# Patient Record
Sex: Male | Born: 1965
Health system: Southern US, Community
[De-identification: ages and names within clinical notes are randomized; demographics above are authoritative.]

## PROBLEM LIST (undated history)

## (undated) HISTORY — PX: HAND SURGERY: SHX662

## (undated) HISTORY — PX: ROTATOR CUFF REPAIR: SHX139

---

## 1997-10-28 ENCOUNTER — Emergency Department (HOSPITAL_COMMUNITY): Admission: EM | Admit: 1997-10-28 | Discharge: 1997-10-28 | Payer: Self-pay | Admitting: Emergency Medicine

## 2002-03-23 ENCOUNTER — Emergency Department (HOSPITAL_COMMUNITY): Admission: EM | Admit: 2002-03-23 | Discharge: 2002-03-23 | Payer: Self-pay | Admitting: Emergency Medicine

## 2004-03-01 ENCOUNTER — Emergency Department (HOSPITAL_COMMUNITY): Admission: EM | Admit: 2004-03-01 | Discharge: 2004-03-02 | Payer: Self-pay | Admitting: Emergency Medicine

## 2004-03-04 ENCOUNTER — Ambulatory Visit (HOSPITAL_BASED_OUTPATIENT_CLINIC_OR_DEPARTMENT_OTHER): Admission: RE | Admit: 2004-03-04 | Discharge: 2004-03-04 | Payer: Self-pay | Admitting: Orthopedic Surgery

## 2006-07-19 ENCOUNTER — Ambulatory Visit (HOSPITAL_BASED_OUTPATIENT_CLINIC_OR_DEPARTMENT_OTHER): Admission: RE | Admit: 2006-07-19 | Discharge: 2006-07-19 | Payer: Self-pay | Admitting: Orthopedic Surgery

## 2007-11-27 IMAGING — CR DG FINGERS 2V UNILAT - NO REPORT
1 series · 3 of 3 positions shown · non-contrast
Comparison: NONE

CLINICAL DATA: Work injury. Pain and swelling of the first MCP 
joint.  

LEFT THUMB FILMS

[Series 1: view not recorded · 0.17mm/px · 3 of 3 slices shown]
[im 1/3]
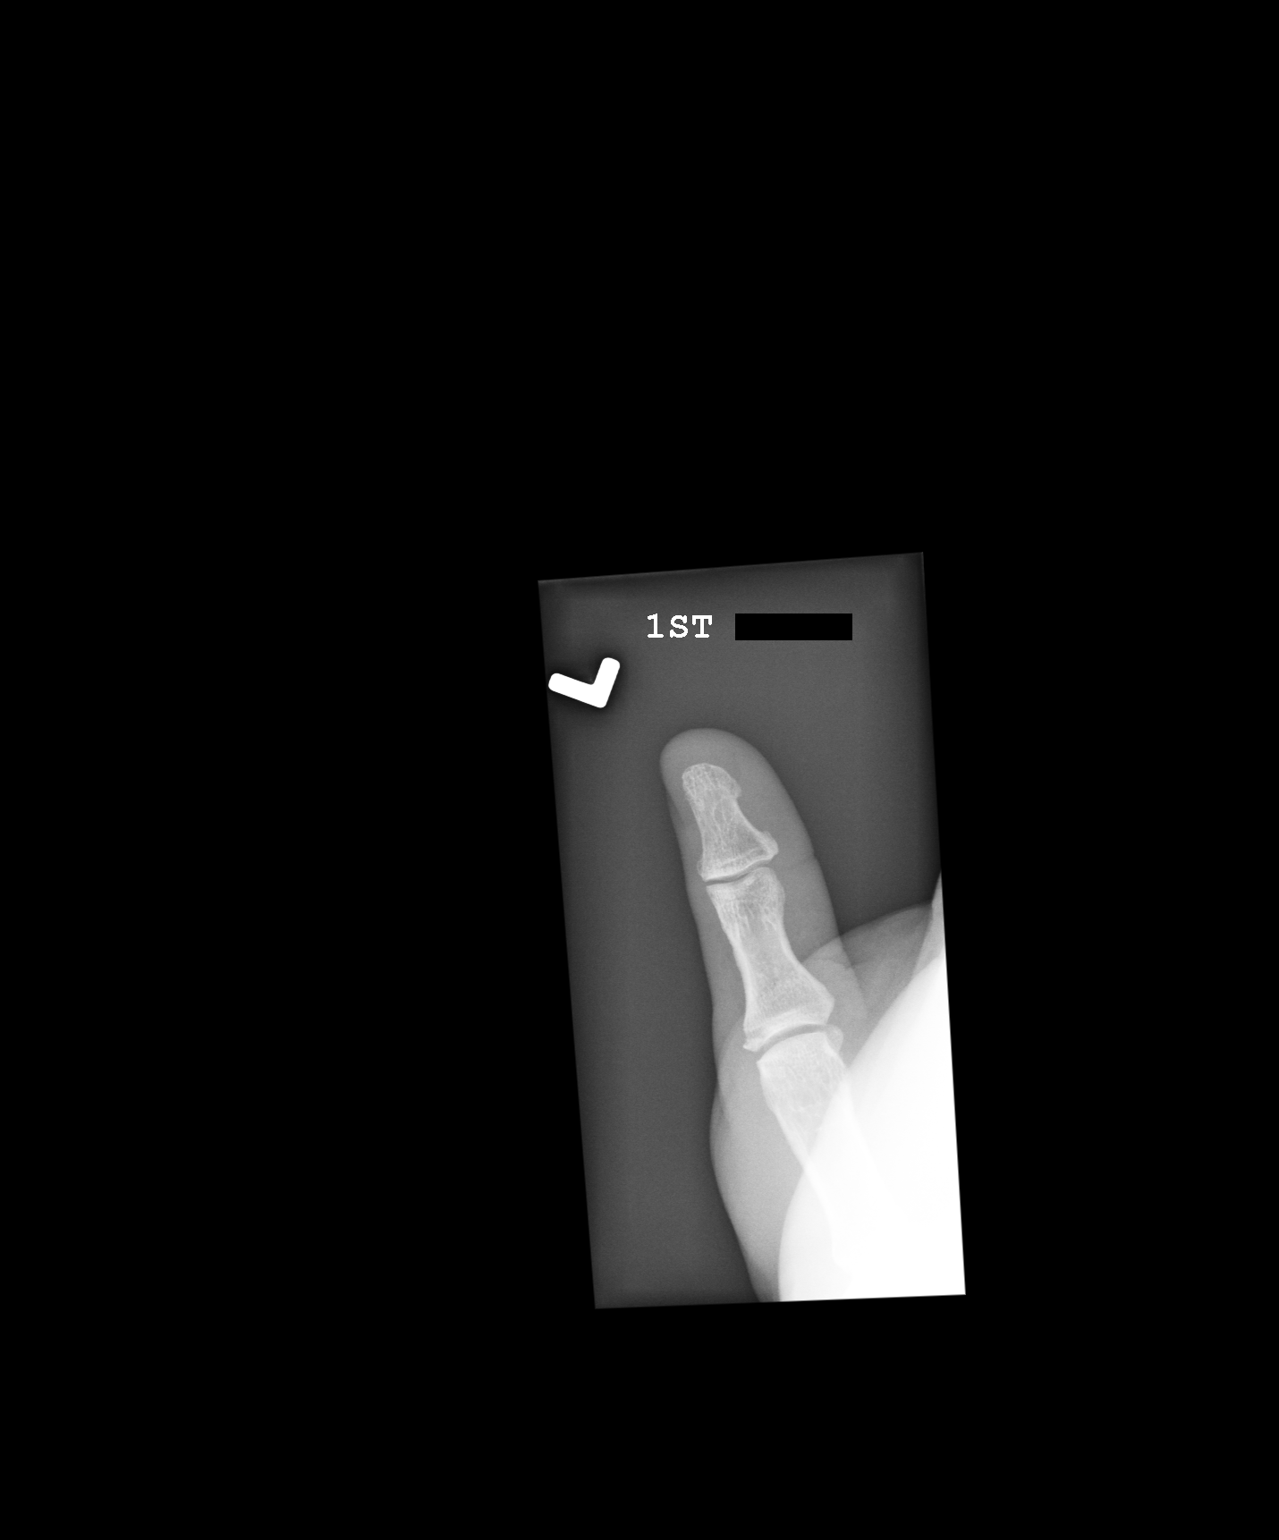
[im 2/3]
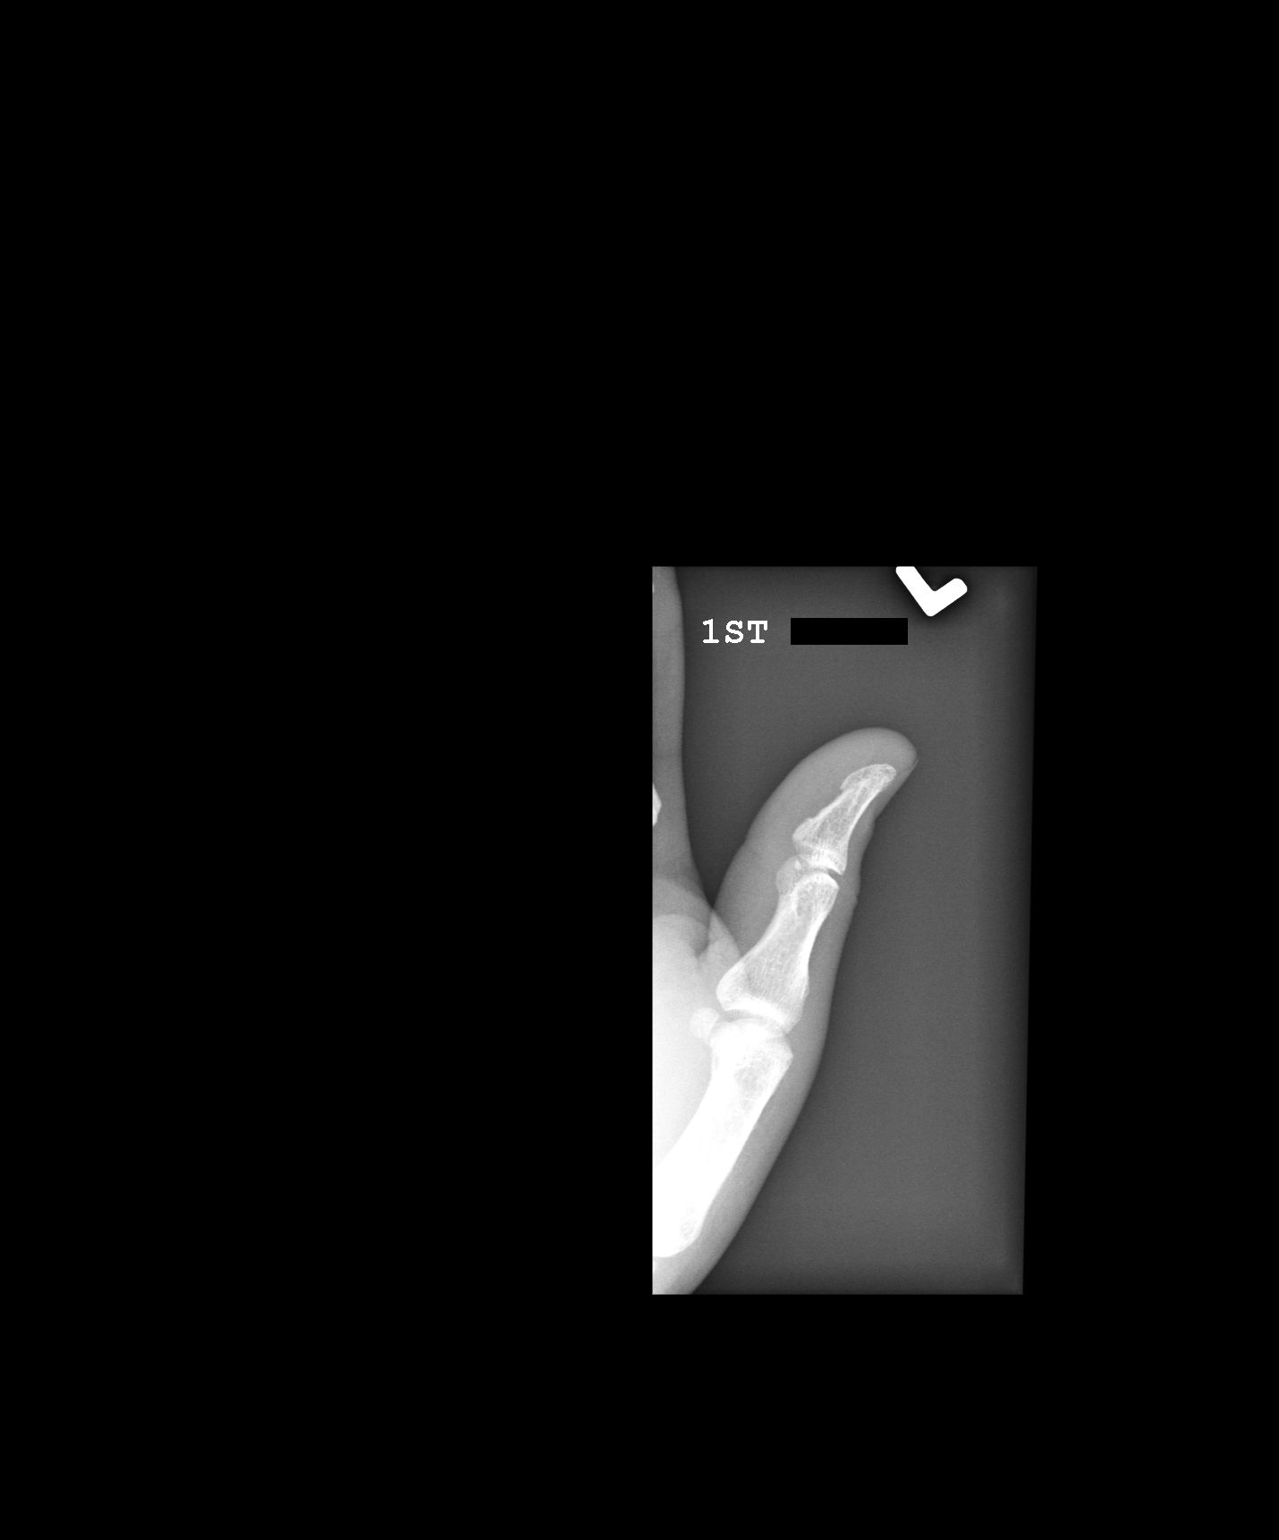
[im 3/3]
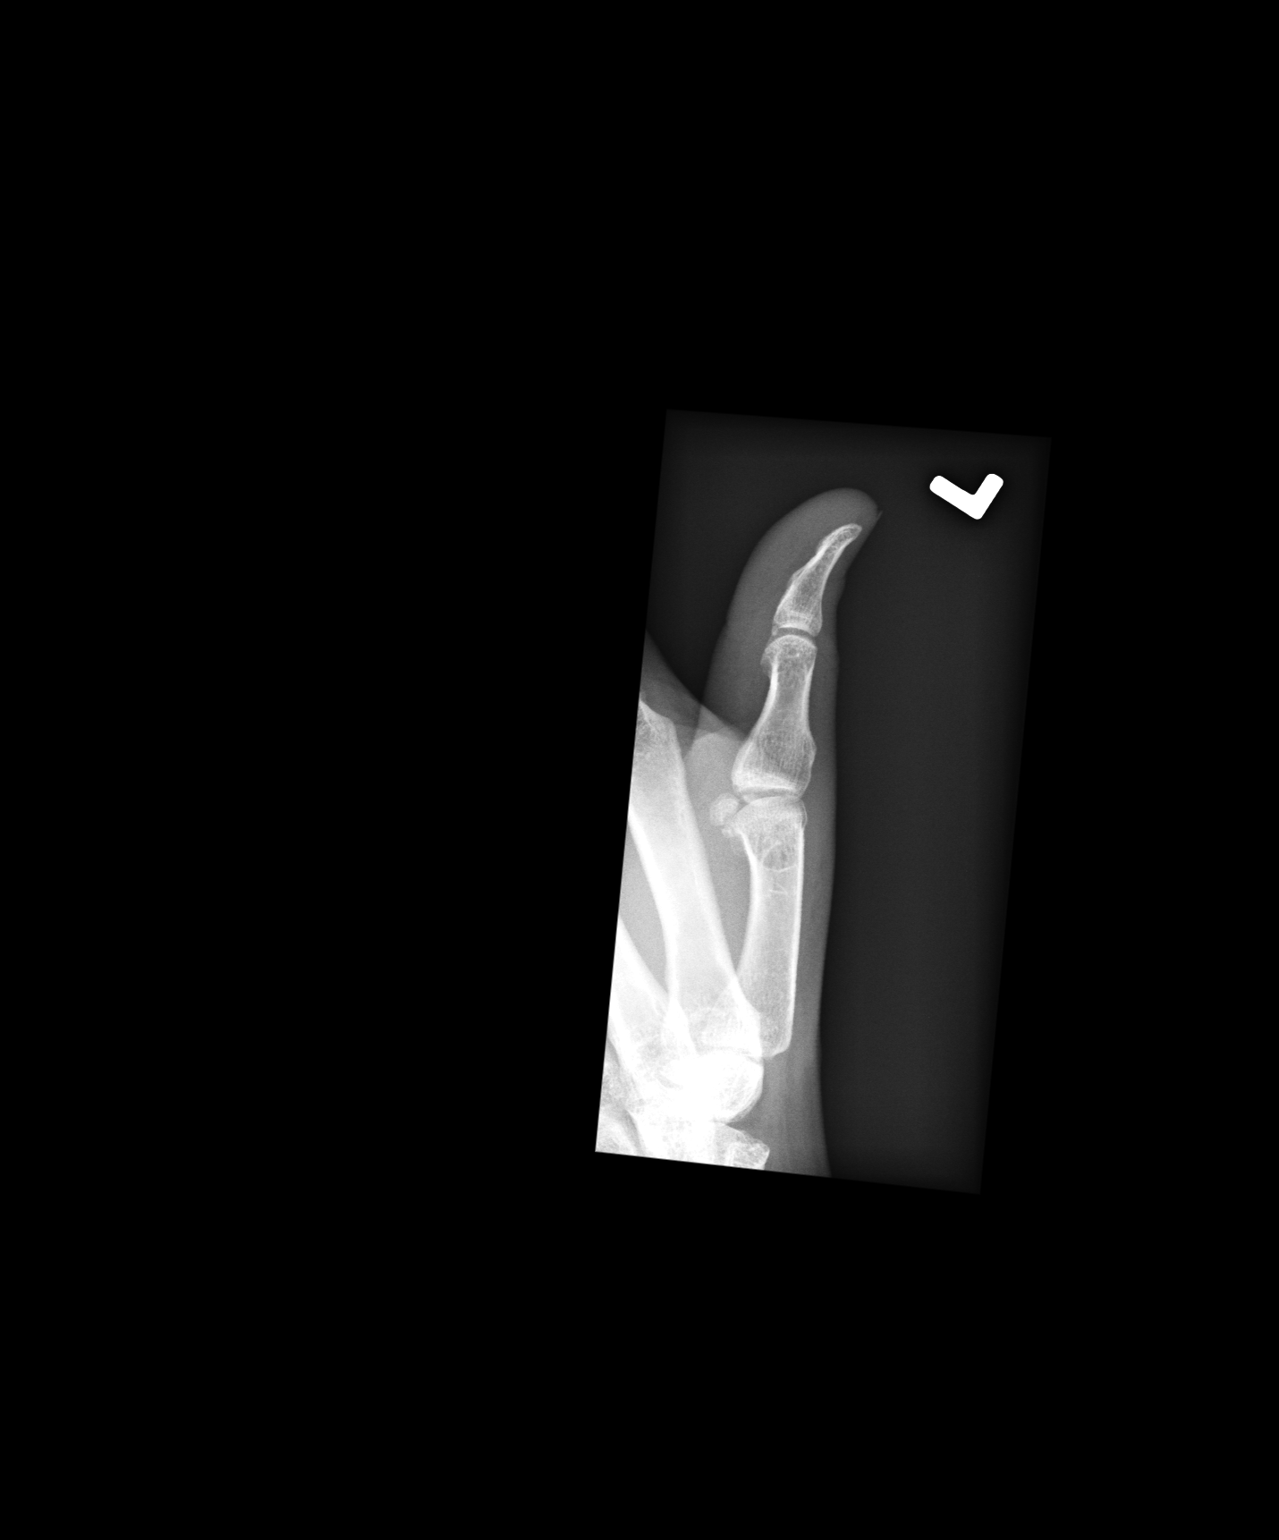

[3 of 3 positions shown; findings below may reference images not displayed]

FINDINGS: Three views were obtained of the first digit of the 
left hand. There is no evidence of acute fracture or dislocation. 
There is a minimal osteophyte seen at the base of the proximal 
phalanx. Articular spaces are within normal limits. No radiopaque 
foreign bodies.
IMPRESSION: Minimal osteophyte at the base of the proximal 
phalanx of the first digit of the left hand. No acute fracture or 
07/09/2006 Dict Date: 07/09/2006  Trans Date: 07/09/2006 JH  [REDACTED]

## 2010-05-31 NOTE — Op Note (Signed)
NAMEJERMAL, DISMUKE              ACCOUNT NO.:  [id number]   MEDICAL RECORD NO.:  [id number]          PATIENT TYPE:  AMB   LOCATION:  DSC                          FACILITY:  [facility]   PHYSICIAN:  Cindee Salt, M.D.       DATE OF BIRTH:  06-05-1965   DATE OF PROCEDURE:  07/19/2006  DATE OF DISCHARGE:                               OPERATIVE REPORT   PREOPERATIVE DIAGNOSIS:  Avulsion radial collateral ligament  metacarpophalangeal joint, left thumb.   POSTOPERATIVE DIAGNOSIS:  Avulsion radial collateral ligament  metacarpophalangeal joint, left thumb.   OPERATION:  Repair of radial collateral ligament metacarpophalangeal  joint, left thumb.   SURGEON:  Cindee Salt, M.D.   ANESTHESIA:  Axillary block.   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  X-rays reveal a small fracture fragment with  rotation with detachment of the radial collateral ligament.  He is  desirous of repair in an effort to have this heal as rapidly as  possibly.  He complains of pain on the radial aspect with use. He is  aware of risks and complications including infection, recurrence, injury  to arteries, nerves, tendons, incomplete relief some symptoms,  stiffness, instability. Questions were encouraged and answered.  In the  preoperative area, the extremity is marked by both the patient and  surgeon.   PROCEDURE:  The patient is brought to the operating room where an  axillary block was carried out without difficulty.  He was prepped using  DuraPrep, supine position, left arm free.  A straight incision was made  longitudinally over the metacarpophalangeal joint of the left thumb,  carried down through subcutaneous tissue.  This was done after  exsanguination of the limb with an Esmarch bandage.  The tourniquet  placed high on the arm was inflated to 250 mmHg.  The dorsal radial and  ulnar sensory branches were identified.  The aponeurosis was then opened  through the abductor. The collateral ligament was then identified after  incising the capsule dorsal to the attachment.  The avulsion was  immediately apparent.  This was rotated 90 degrees. The fracture  fragment was small.  This was excised.  Drill holes were then placed  from the ulnar to radial side exiting in the fracture defect of the  proximal phalanx.  Blunt needles were then inserted through each of the  drill holes. The joint was irrigated. The collateral ligament was then  repaired using a modified Kessler with 4-0 Mersilene sutures, tying it  over the ulnar cortex of the proximal phalanx.  This stabilized the  ligament in position into the defect created by the old fracture.  The  abductor aponeurosis was then repaired with figure-of-eight 4-0  Mersilene sutures.  The skin was repaired with interrupted 5-0 Vicryl  Rapide sutures.  The position of the drill holes was confirmed with OEC  image intensification in both AP and lateral directions. The thumb was  stable following this and able to pass through a full range motion  without disruption of the ligament repair prior to closure.  A sterile  compressive dressing and splint was applied.  On  deflation of the tourniquet, all fingers immediately pinked.  He was  taken to the recovery room for observation in satisfactory condition.  He will be discharged home to return to the [facility] [place] in  one week on Percocet.           ______________________________  Cindee Salt, M.D.     GK/MEDQ  D:  07/19/2006  T:  07/19/2006  Job:  [id number]

## 2010-06-03 NOTE — Op Note (Signed)
NAMEGARCIA, DALZELL              ACCOUNT NO.:  [id number]   MEDICAL RECORD NO.:  [id number]          PATIENT TYPE:  AMB   LOCATION:  DSC                          FACILITY:  [facility]   PHYSICIAN:  Cindee Salt, M.D.       DATE OF BIRTH:  October 07, 1965   DATE OF PROCEDURE:  03/04/2004  DATE OF DISCHARGE:                                 OPERATIVE REPORT   PREOPERATIVE DIAGNOSIS:  Laceration of artery, tendons and nerves, left  index finger.   POSTOPERATIVE DIAGNOSIS:  Laceration of artery, tendons and nerves, left  index finger.   OPERATION:  Repair of flexor digitorum profundus, flexor digitorum  superficialis, radial digital artery, radial digital nerve, ulnar digital  nerve, left index finger.   SURGEON:  Cindee Salt, M.D.   ASSISTANTCarolyne Fiscal.   ANESTHESIA:  General.   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  He is admitted  now for exploration and repairs.  He was seen initially in the emergency  room, where the wound was closed and he was referred.   PROCEDURE:  The patient was brought to the operating room where a general  anesthetic was carried out without difficulty.  He was prepped using  DuraPrep, supine position, left arm free. The limb was exsanguinated with an  Esmarch bandage.  Tourniquet placed high on the arm was inflated to 250  mmHg.  The wound was opened.  The laceration was noted to go down into the  PIP joint with a laceration volar plate.  Both flexor tendons were  lacerated.  The radial digital artery and nerve were lacerated.  The ulnar  digital nerve was lacerated.  The ulnar digital artery was intact.  The  wound was extended in mid-lateral line, ulnar aspect, carried down through  subcutaneous tissue.  Two flaps were raised.  The wound was then copiously  irrigated with saline. The volar plate was repaired with a running 5-0  chromic suture.  The tendons were able to be  retrieved, being tented by the  vincula.  The repair was then performed to the superficialis using modified  Kessler using 4-0 FiberWire in each limb.  The profundus tendon was then  pinned with a 25-gauge needle and a repair performed using a modified  Kessler with the 4-0 FiberWire and a 6-0 Mersilene in the epitenon.  The  sheath was released slightly on the ulnar aspect of the A4 pulley to  facilitate travel distally.  There was no further triggering noted.  The  operative microscope was then brought into position, the ends of the artery  was cut back to normal intima and a repair performed with interrupted 9-0  nylon suture using the back-wall-first technique.  The digital artery on the  radial aspect was cut back to normal fascicles; an epineural repair was then  performed, aligning fascicles; this was done with interrupted 9-0 nylon  sutures using the operative microscope.  Similarly, the ulnar digital nerve  was repaired with an epineural suture using 9-0 Ethibond.  The wound was  then copiously irrigated with saline and the skin closed interrupted 5-0  nylon sutures.  A sterile compressive dressing and  splint were applied.  With deflation of the tourniquet, the finger pinked.  He was taken to the recovery observation in satisfactory condition.   He will be discharged home to return to the [facility] [place] in 1  week on Vicodin.      GK/MEDQ  D:  03/04/2004  T:  03/05/2004  Job:  [id number]

## 2010-11-01 LAB — POCT HEMOGLOBIN-HEMACUE: Operator id: 128471

## 2021-03-07 ENCOUNTER — Emergency Department (INDEPENDENT_AMBULATORY_CARE_PROVIDER_SITE_OTHER)
Admission: EM | Admit: 2021-03-07 | Discharge: 2021-03-07 | Disposition: A | Discharge status: Home / Self Care | Payer: 59 | Source: Home / Self Care

## 2021-03-07 ENCOUNTER — Other Ambulatory Visit: Payer: Self-pay

## 2021-03-07 DIAGNOSIS — M542 Cervicalgia: Secondary | ICD-10-CM | POA: Diagnosis not present

## 2021-03-07 DIAGNOSIS — S46912A Strain of unspecified muscle, fascia and tendon at shoulder and upper arm level, left arm, initial encounter: Secondary | ICD-10-CM | POA: Diagnosis not present

## 2021-03-07 MED ORDER — BACLOFEN 10 MG PO TABS: 10.0000 mg | ORAL_TABLET | Freq: Three times a day (TID) | ORAL | 0 refills | Status: AC

## 2021-03-07 MED ORDER — PREDNISONE 20 MG PO TABS: ORAL_TABLET | ORAL | 0 refills | Status: AC

## 2021-03-07 NOTE — Discharge Instructions (Addendum)
Advised patient to take medication as directed with food to completion.  Advised patient may take Baclofen daily or as needed for accompanying muscle spasms.  Encouraged patient to increase daily water intake while taking these medications.  Patient is symptoms worsen and/or unresolved please follow-up with PCP or here for further evaluation.

## 2021-03-07 NOTE — ED Triage Notes (Signed)
Pt states that has some left shoulder pain that radiates down his left arm. X2 months.   Pt states that he has some neck pain with it as well.  Pt states that his symptoms began when he had covid in 12/2020.

## 2021-03-07 NOTE — ED Provider Notes (Signed)
[facility]    CSN: [id number] Arrival date & time: 03/07/21  1001      History   Chief Complaint Chief Complaint  Patient presents with   Shoulder Pain    Left shoulder pain x2 months    HPI Charles Rivera is a 56 y.o. male.   HPI 56 year old male presents with neck pain and left shoulder pain x 2 months.  Patient denies injury or insult to either neck or left shoulder.  History reviewed. No pertinent past medical history.  There are no problems to display for this patient.   Past Surgical History:  Procedure Laterality Date   HAND SURGERY Left    ROTATOR CUFF REPAIR Right        Home Medications    Prior to Admission medications   Medication Sig Start Date End Date Taking? Authorizing Provider  allopurinol (ZYLOPRIM) 100 MG tablet allopurinol 100 mg tablet  TAKE 2 TABLETS BY MOUTH EVERY DAY   Yes [provider]  atorvastatin (LIPITOR) 10 MG tablet atorvastatin 10 mg tablet  TAKE 1 TABLET BY MOUTH EVERY DAY   Yes [provider]  baclofen (LIORESAL) 10 MG tablet Take 1 tablet (10 mg total) by mouth 3 (three) times daily. 03/07/21  Yes Trevor Iha, FNP  ergocalciferol (VITAMIN D2) 1.25 MG (50000 UT) capsule ergocalciferol (vitamin D2) 1,250 mcg (50,000 unit) capsule  TAKE 1 CAPSULE (50,000 UNITS TOTAL) BY MOUTH ONCE A WEEK.   Yes [provider]  lisinopril-hydrochlorothiazide (ZESTORETIC) 10-12.5 MG tablet lisinopril 10 mg-hydrochlorothiazide 12.5 mg tablet  TAKE 1 TABLET BY MOUTH EVERY DAY   Yes [provider]  predniSONE (DELTASONE) 20 MG tablet Take 3 tabs PO daily x 3 days, then 2 tabs PO daily x 3 days, then 1 tab PO daily x 3 days 03/07/21  Yes Trevor Iha, FNP    Family History Family History  Problem Relation Age of Onset   Hypertension Mother    Hypertension Sister    Hypertension Brother     Social History Social History   Tobacco Use   Smoking status: Never   Smokeless tobacco: Never   Substance Use Topics   Alcohol use: Never   Drug use: Never     Allergies   Patient has no known allergies.   Review of Systems Review of Systems  Musculoskeletal:  Positive for neck pain.       Left shoulder pain x 2 months  All other systems reviewed and are negative.   Physical Exam Triage Vital Signs ED Triage Vitals  Enc Vitals Group     BP 03/07/21 1142 (!) 150/87     Pulse Rate 03/07/21 1142 62     Resp 03/07/21 1142 18     Temp 03/07/21 1142 98.3 F (36.8 C)     Temp Source 03/07/21 1142 Oral     SpO2 03/07/21 1142 98 %     Weight 03/07/21 1137 205 lb (93 kg)     Height 03/07/21 1137 5\' 9"  (1.753 m)     Head Circumference --      Peak Flow --      Pain Score 03/07/21 1136 2     Pain Loc --      Pain Edu? --      Excl. in GC? --    No data found.  Updated Vital Signs BP (!) 150/87 (BP Location: Left Arm)    Pulse 62    Temp 98.3 F (36.8 C) (Oral)  Resp 18    Ht 5\' 9"  (1.753 m)    Wt 205 lb (93 kg)    SpO2 98%    BMI 30.27 kg/m     Physical Exam Vitals and nursing note reviewed.  Constitutional:      General: He is not in acute distress.    Appearance: Normal appearance. He is obese. He is not ill-appearing.  HENT:     Mouth/Throat:     Mouth: Mucous membranes are moist.     Pharynx: Oropharynx is clear.  Eyes:     Extraocular Movements: Extraocular movements intact.     Conjunctiva/sclera: Conjunctivae normal.     Pupils: Pupils are equal, round, and reactive to light.  Neck:     Comments: Full range of motion with 4 planes of motion Cardiovascular:     Rate and Rhythm: Normal rate and regular rhythm.     Pulses: Normal pulses.     Heart sounds: Normal heart sounds.  Pulmonary:     Effort: Pulmonary effort is normal.     Breath sounds: Normal breath sounds.  Musculoskeletal:     Cervical back: Normal range of motion and neck supple.     Comments: Left shoulder: TTP over [facility] joint, full range of motion with flexion/extension,  internal/external rotation, scapular retraction, and horizontal abduction.  Skin:    General: Skin is warm and dry.  Neurological:     General: No focal deficit present.     Mental Status: He is alert and oriented to person, place, and time.     UC Treatments / Results  Labs (all labs ordered are listed, but only abnormal results are displayed) Labs Reviewed - No data to display  EKG   Radiology No results found.  Procedures Procedures (including critical care time)  Medications Ordered in UC Medications - No data to display  Initial Impression / Assessment and Plan / UC Course  I have reviewed the triage vital signs and the nursing notes.  Pertinent labs & imaging results that were available during my care of the patient were reviewed by me and considered in my medical decision making (see chart for details).     MDM: 1.  Neck pain-Rx'd Baclofen; 2.  Strain of left shoulder, initial encounter-Rx'd Prednisone taper. Advised patient to take medication as directed with food to completion.  Advised patient may take Baclofen daily or as needed for accompanying muscle spasms.  Encouraged patient to increase daily water intake while taking these medications.  Patient is symptoms worsen and/or unresolved please follow-up with PCP or here for further evaluation.  Discharged home, hemodynamically stable. Final Clinical Impressions(s) / UC Diagnoses   Final diagnoses:  Neck pain, bilateral posterior  Strain of left shoulder, initial encounter     Discharge Instructions      Advised patient to take medication as directed with food to completion.  Advised patient may take Baclofen daily or as needed for accompanying muscle spasms.  Encouraged patient to increase daily water intake while taking these medications.  Patient is symptoms worsen and/or unresolved please follow-up with PCP or here for further evaluation.     ED Prescriptions     Medication Sig Dispense Auth. Provider    baclofen (LIORESAL) 10 MG tablet Take 1 tablet (10 mg total) by mouth 3 (three) times daily. 30 each [facility], FNP   predniSONE (DELTASONE) 20 MG tablet Take 3 tabs PO daily x 3 days, then 2 tabs PO daily x 3 days, then 1  tab PO daily x 3 days 18 tablet Trevor Iha, FNP      PDMP not reviewed this encounter.   Trevor Iha, FNP 03/07/21 1313

## 2022-01-11 ENCOUNTER — Ambulatory Visit: Payer: 59 | Admitting: Podiatry

## 2022-01-11 DIAGNOSIS — B351 Tinea unguium: Secondary | ICD-10-CM | POA: Diagnosis not present

## 2022-01-11 NOTE — Progress Notes (Signed)
   Chief Complaint  Patient presents with   foot care    Patient is here for left foot great toe nail fungus.patient has been seen at urgent care and was given rx to put on the nail to clear the fungus and now the patient is having pain.    Subjective: 56 y.o. male presenting today as a new patient for evaluation of thickening with sensitivity to the left hallux nail plate.  Patient states that he went to the urgent care about 2 weeks ago and topical Lamisil was dispensed.  He has been applying it daily.  Presenting for further treatment and evaluation  No past medical history on file.  Past Surgical History:  Procedure Laterality Date   HAND SURGERY Left    ROTATOR CUFF REPAIR Right     No Known Allergies  Objective: Physical Exam General: The patient is alert and oriented x3 in no acute distress.  Dermatology: Hyperkeratotic, discolored, thickened, onychodystrophy noted left hallux nail plate. Skin is warm, dry and supple bilateral lower extremities. Negative for open lesions or macerations.  Vascular: Palpable pedal pulses bilaterally. No edema or erythema noted. Capillary refill within normal limits.  Neurological: Grossly intact via light touch.   Musculoskeletal Exam: No pedal deformity noted  Assessment: #1 Onychomycosis of toenail left hallux nail plate  Plan of Care:  #1 Patient was evaluated. #2  Mechanical debridement of the nail plate was performed today and a Dremel was utilized to smooth the nail plate.  Patient felt significant relief #3 continue topical antifungal that was dispensed at the urgent care daily #4 return to clinic as needed   Felecia Shelling, DPM [facility]  Dr. Felecia Shelling, DPM    [address] [place], [place] [address]                Office [phone]  Fax [phone]

## 2022-07-31 ENCOUNTER — Telehealth: Payer: Self-pay | Admitting: Podiatry

## 2022-07-31 ENCOUNTER — Other Ambulatory Visit: Payer: Self-pay | Admitting: Podiatry

## 2022-07-31 MED ORDER — CICLOPIROX 8 % EX SOLN: Freq: Every day | CUTANEOUS | 3 refills | Status: AC

## 2022-07-31 MED ORDER — CICLOPIROX 8 % EX SOLN: Freq: Every day | CUTANEOUS | 0 refills | Status: DC

## 2022-07-31 NOTE — Progress Notes (Signed)
Refill ciclopirox 8% solution.  Apply daily.  Felecia Shelling, DPM [facility]  Dr. Felecia Shelling, DPM    [address] [place], [place] [address]                Office [phone]  Fax [phone]

## 2022-07-31 NOTE — Telephone Encounter (Signed)
Patient needs a refill on the prescription for ciclopirox topical solution 8% the urgent care had prescribed it and you wanted him to continue with that treatment but he is about out and needs a refill please call and advise with treatment plan and refill instructions.
